# Patient Record
Sex: Male | Born: 1990 | Race: Black or African American | Hispanic: No | Marital: Single
Health system: Southern US, Community
[De-identification: ages and names within clinical notes are randomized; demographics above are authoritative.]

---
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
## 2012-11-13 ENCOUNTER — Other Ambulatory Visit: Payer: Self-pay | Admitting: Family Medicine

## 2012-11-13 DIAGNOSIS — M542 Cervicalgia: Secondary | ICD-10-CM

## 2012-11-15 ENCOUNTER — Ambulatory Visit
Admission: RE | Admit: 2012-11-15 | Discharge: 2012-11-15 | Disposition: A | Discharge status: Home / Self Care | Payer: Managed Care, Other (non HMO) | Source: Ambulatory Visit | Attending: Family Medicine | Admitting: Family Medicine

## 2012-11-15 DIAGNOSIS — M542 Cervicalgia: Secondary | ICD-10-CM

## 2014-07-06 ENCOUNTER — Other Ambulatory Visit (HOSPITAL_COMMUNITY): Payer: Self-pay | Admitting: Family Medicine

## 2014-07-06 ENCOUNTER — Ambulatory Visit (HOSPITAL_COMMUNITY)
Admission: RE | Admit: 2014-07-06 | Discharge: 2014-07-06 | Disposition: A | Discharge status: Home / Self Care | Payer: Managed Care, Other (non HMO) | Source: Ambulatory Visit | Attending: Family Medicine | Admitting: Family Medicine

## 2014-07-06 ENCOUNTER — Other Ambulatory Visit: Payer: Self-pay | Admitting: Family Medicine

## 2014-07-06 DIAGNOSIS — S0083XA Contusion of other part of head, initial encounter: Principal | ICD-10-CM | POA: Insufficient documentation

## 2014-07-06 DIAGNOSIS — S02401A Maxillary fracture, unspecified, initial encounter for closed fracture: Secondary | ICD-10-CM

## 2014-07-06 DIAGNOSIS — S0003XA Contusion of scalp, initial encounter: Secondary | ICD-10-CM | POA: Diagnosis not present

## 2014-07-06 DIAGNOSIS — R6884 Jaw pain: Secondary | ICD-10-CM | POA: Diagnosis present

## 2014-07-06 DIAGNOSIS — S0993XA Unspecified injury of face, initial encounter: Secondary | ICD-10-CM | POA: Diagnosis present

## 2014-07-06 DIAGNOSIS — Y9361 Activity, american tackle football: Secondary | ICD-10-CM | POA: Diagnosis not present

## 2014-07-06 DIAGNOSIS — S1093XA Contusion of unspecified part of neck, initial encounter: Secondary | ICD-10-CM | POA: Diagnosis not present

## 2014-09-18 ENCOUNTER — Other Ambulatory Visit: Payer: Self-pay | Admitting: Family Medicine

## 2014-09-18 DIAGNOSIS — M542 Cervicalgia: Secondary | ICD-10-CM

## 2014-09-20 ENCOUNTER — Ambulatory Visit
Admission: RE | Admit: 2014-09-20 | Discharge: 2014-09-20 | Disposition: A | Discharge status: Home / Self Care | Payer: Managed Care, Other (non HMO) | Source: Ambulatory Visit | Attending: Family Medicine | Admitting: Family Medicine

## 2014-09-20 DIAGNOSIS — M542 Cervicalgia: Secondary | ICD-10-CM

## 2014-09-25 ENCOUNTER — Other Ambulatory Visit: Payer: PRIVATE HEALTH INSURANCE

## 2016-02-08 IMAGING — MR MR CERVICAL SPINE W/O CM
4 of 5 series · 21 of 48 positions shown · non-contrast
Comparison: Cervical MRI 11/15/2012.  Maxillofacial CT 07/06/2014.

CLINICAL DATA: Football injury 2 months ago. Recurring zingers in
the right arm with neck pain and right hand numbness. No previous
relevant surgery.

EXAM:
MRI CERVICAL SPINE WITHOUT CONTRAST
TECHNIQUE: Multiplanar, multisequence MR imaging of the cervical spine was
performed. No intravenous contrast was administered.

[Series 2: T2 · sagittal · 3.0mm · 0.41mm/px · 6 of 12 slices shown (1 of 3)]
[im 1/12]
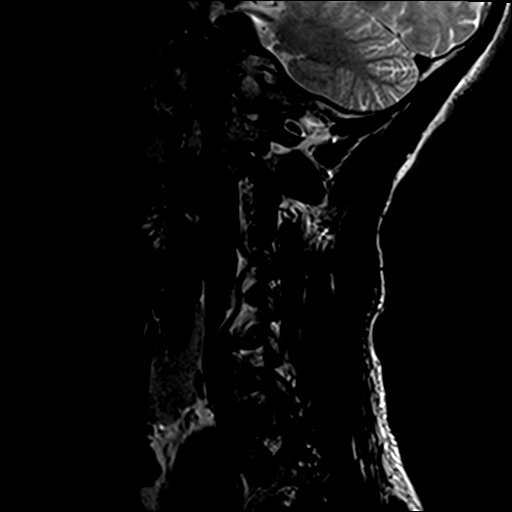
[im 3/12]
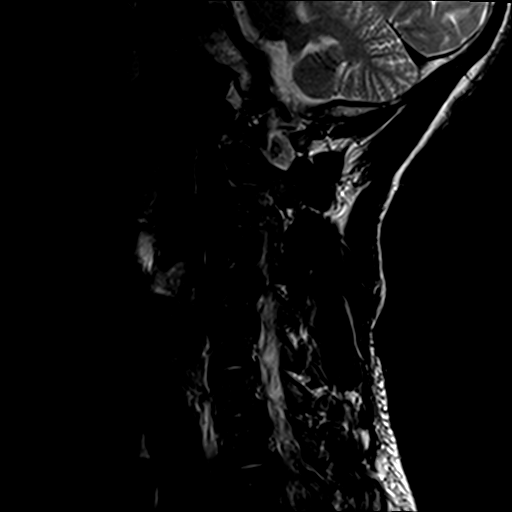
[im 5/12]
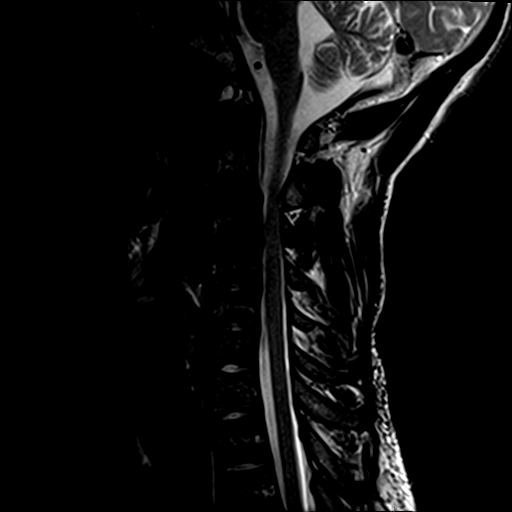
[im 7/12]
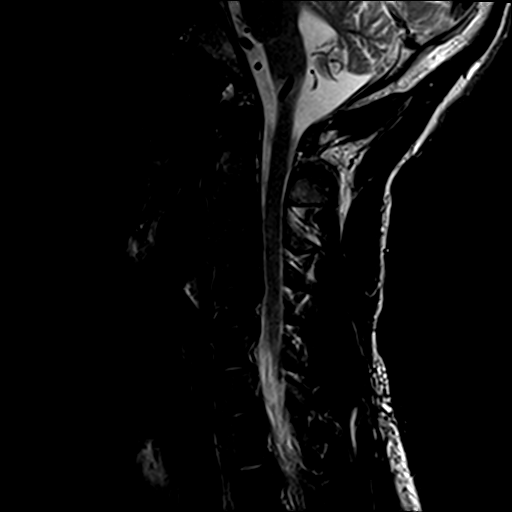
[im 9/12]
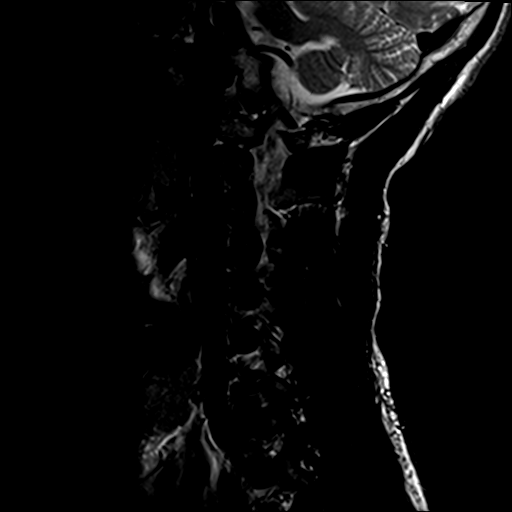
[im 12/12]
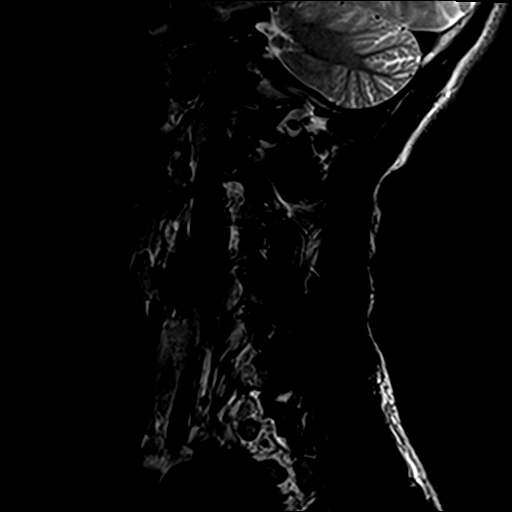

[Series 3: T1 · sagittal · 3.0mm · 0.41mm/px · 3 of 12 slices shown]
[im 3/12]
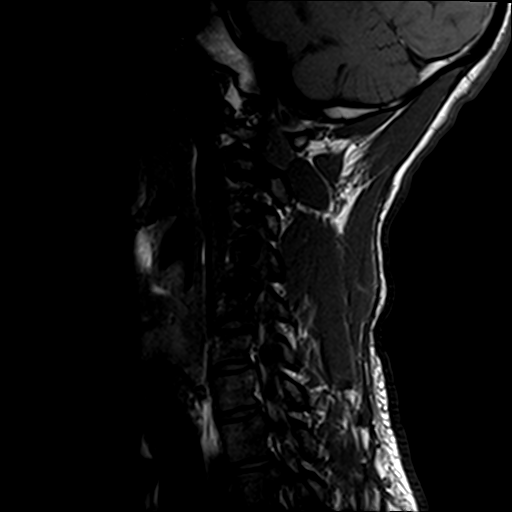
[im 7/12]
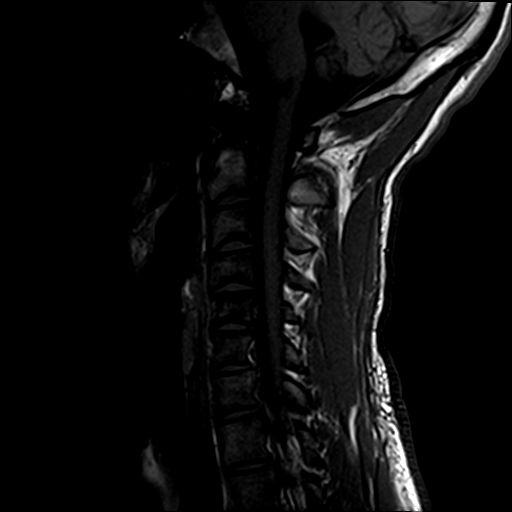
[im 12/12]
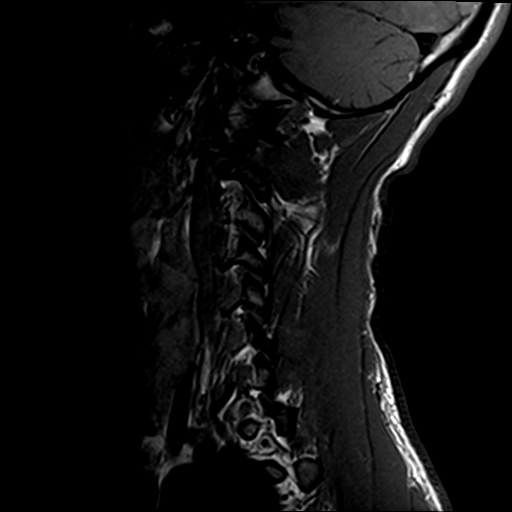

[Series 5: T2 · axial · 3.0mm · 0.39mm/px · z∈[-54,+56]mm · 9 of 30 slices shown (2 of 3)]
[im 1/30]
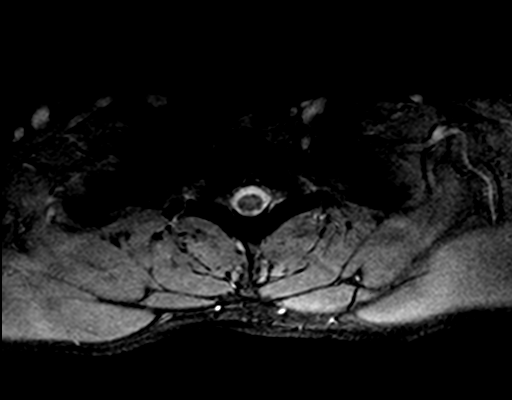
[im 5/30]
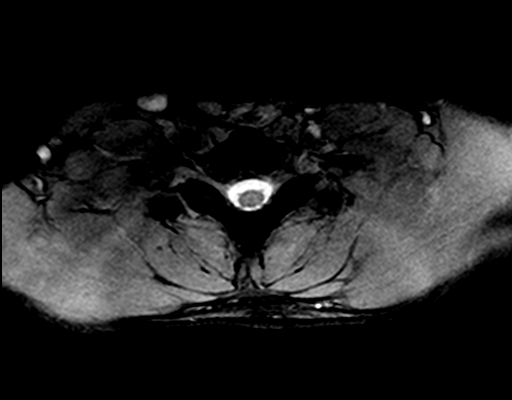
[im 9/30]
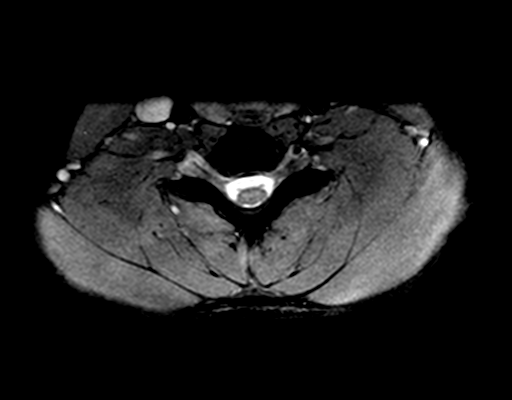
[im 13/30]
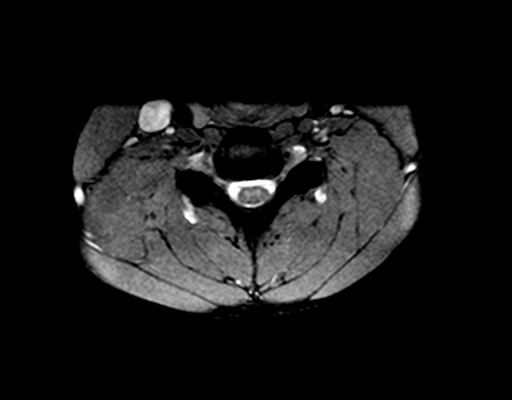
[im 15/30]
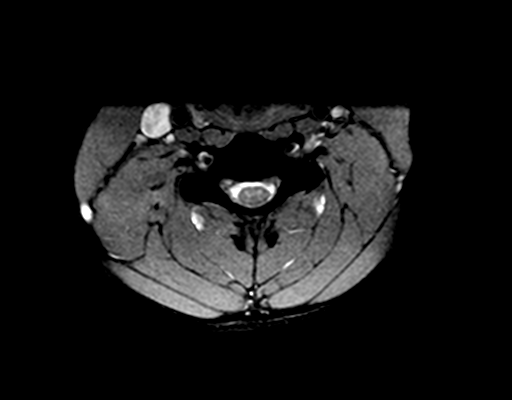
[im 17/30]
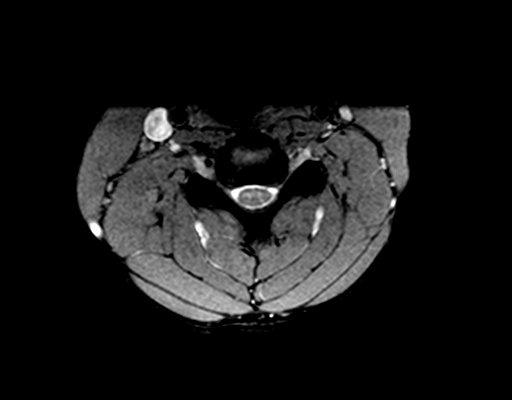
[im 21/30]
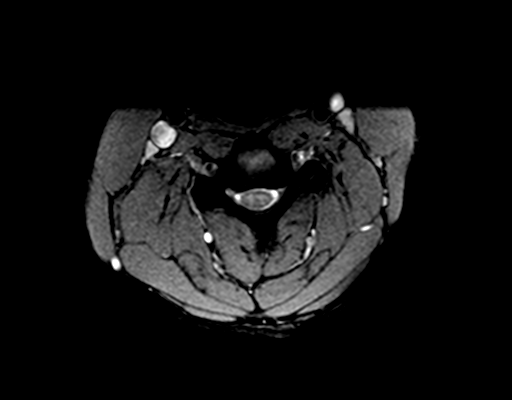
[im 25/30]
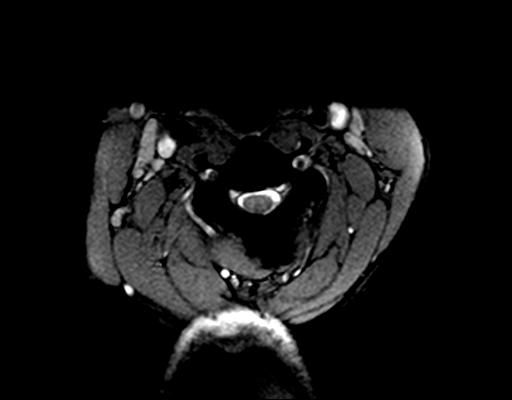
[im 30/30]
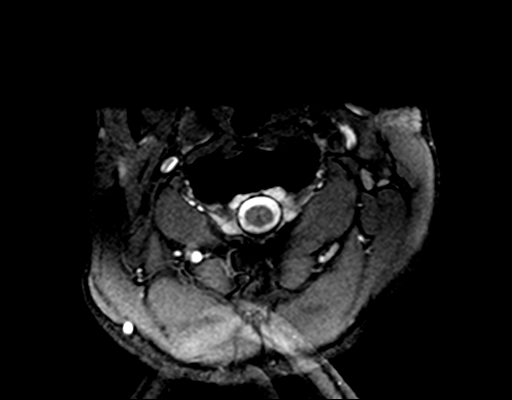

[Series 6: T2 · axial · 3.0mm · 0.39mm/px · z∈[-39,+36]mm · 3 of 30 slices shown (3 of 3)]
[im 5/30]
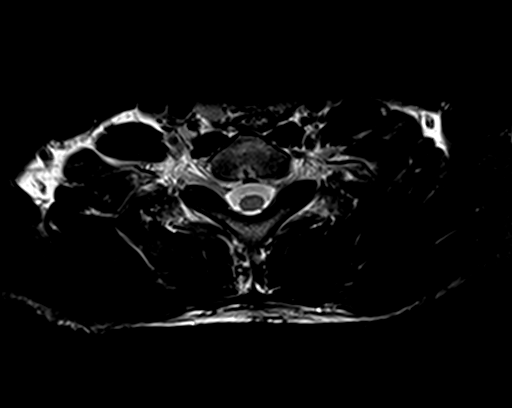
[im 15/30]
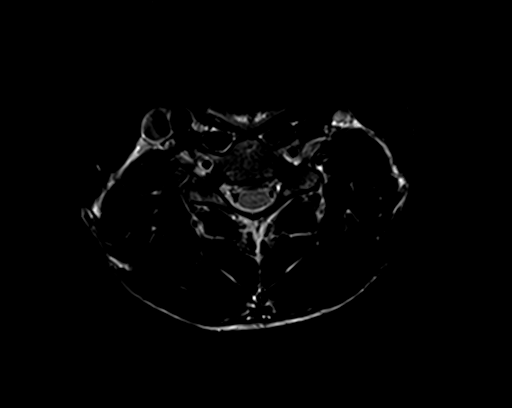
[im 25/30]
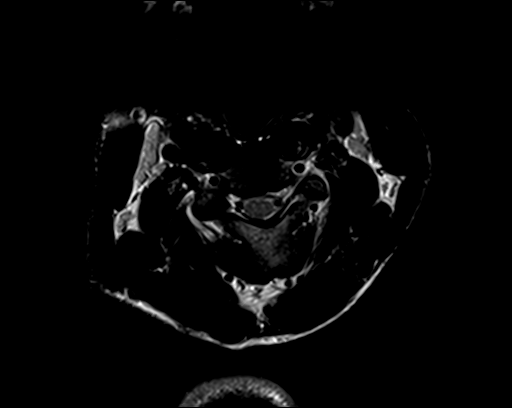

[21 of 48 positions shown; findings below may reference images not displayed]

FINDINGS: The cervical alignment is stable with mild straightening. There is
no evidence of acute fracture or paraspinal ligamentous injury. As
previously noted, the spinal canal is relatively small on a
congenital basis.

The craniocervical junction appears normal. The cervical cord is
normal in signal and caliber. There are bilateral vertebral artery
flow voids.

C2-3: Stable asymmetric uncinate spurring on the right contributing
to borderline right foraminal stenosis. The AP diameter of the canal
is 10 mm without cord deformity.

C3-4: Stable mild disc bulge and bilateral uncinate spurring
contributing to moderate left and mild right foraminal stenosis. The
AP diameter of the canal is 9 mm. There is no cord deformity.

C4-5: Stable disc bulging and uncinate spurring contributing to mild
to moderate biforaminal stenosis. AP diameter of the canal is 9 mm
without cord deformity.

C5-6: Stable disc bulging and mild uncinate spurring. There is mild
left greater than right foraminal stenosis. The central canal is
adequately patent.

C6-7:  Normal interspace.

C7-T1:  Normal interspace.
IMPRESSION: 1. Stable examination of the cervical spine compared with prior
study from nearly 2 years ago.
2. There is mild disc bulging and uncinate spurring superimposed on
a congenitally small spinal canal. The resulting foraminal narrowing
is unchanged, greatest at the C3-4 and C4-5 levels.
3. No cord deformity or abnormal cord signal.
